# Patient Record
Sex: Female | Born: 1965 | Race: White | Hispanic: No | Smoking: Current every day smoker
Health system: Southern US, Community
[De-identification: ages and names within clinical notes are randomized; demographics above are authoritative.]

## PROBLEM LIST (undated history)

## (undated) HISTORY — PX: NEPHRECTOMY TRANSPLANTED ORGAN: SUR880

## (undated) HISTORY — PX: TONSILLECTOMY: SUR1361

## (undated) HISTORY — PX: OTHER SURGICAL HISTORY: SHX169

## (undated) HISTORY — PX: BACK SURGERY: SHX140

---

## 2004-01-28 ENCOUNTER — Encounter: Admission: RE | Admit: 2004-01-28 | Discharge: 2004-01-28 | Payer: Self-pay | Admitting: Neurosurgery

## 2004-02-18 ENCOUNTER — Encounter: Admission: RE | Admit: 2004-02-18 | Discharge: 2004-02-18 | Payer: Self-pay | Admitting: Neurosurgery

## 2004-03-03 ENCOUNTER — Encounter: Admission: RE | Admit: 2004-03-03 | Discharge: 2004-03-03 | Payer: Self-pay | Admitting: Neurosurgery

## 2004-04-18 ENCOUNTER — Ambulatory Visit (HOSPITAL_COMMUNITY): Admission: RE | Admit: 2004-04-18 | Discharge: 2004-04-19 | Payer: Self-pay | Admitting: Neurosurgery

## 2004-10-20 ENCOUNTER — Emergency Department: Payer: Self-pay | Admitting: Emergency Medicine

## 2006-07-26 ENCOUNTER — Ambulatory Visit: Payer: Self-pay | Admitting: Obstetrics and Gynecology

## 2007-01-17 ENCOUNTER — Encounter: Admission: RE | Admit: 2007-01-17 | Discharge: 2007-01-17 | Payer: Self-pay | Admitting: Occupational Medicine

## 2007-01-30 ENCOUNTER — Encounter: Admission: RE | Admit: 2007-01-30 | Discharge: 2007-03-17 | Payer: Self-pay | Admitting: Occupational Medicine

## 2007-02-06 ENCOUNTER — Encounter: Admission: RE | Admit: 2007-02-06 | Discharge: 2007-02-06 | Payer: Self-pay | Admitting: Neurosurgery

## 2007-02-09 ENCOUNTER — Encounter: Admission: RE | Admit: 2007-02-09 | Discharge: 2007-02-09 | Payer: Self-pay | Admitting: Neurosurgery

## 2007-09-08 ENCOUNTER — Encounter: Payer: Self-pay | Admitting: Family Medicine

## 2007-09-13 ENCOUNTER — Encounter: Payer: Self-pay | Admitting: Family Medicine

## 2007-09-20 ENCOUNTER — Emergency Department: Payer: Self-pay | Admitting: Emergency Medicine

## 2007-10-13 ENCOUNTER — Encounter: Payer: Self-pay | Admitting: Family Medicine

## 2007-11-18 ENCOUNTER — Emergency Department: Payer: Self-pay | Admitting: Emergency Medicine

## 2007-11-21 ENCOUNTER — Emergency Department: Payer: Self-pay | Admitting: Internal Medicine

## 2008-05-02 IMAGING — CR DG LUMBAR SPINE COMPLETE 4+V
4 series · 4 of 4 positions shown · non-contrast
Comparison: Intraoperative lateral lumbar spine radiograph 04/18/04.

CLINICAL DATA: Low back and bilateral leg pain.  Lumbar surgery in 3334. 
 LUMBAR SPINE FOUR VIEWS:

[view not recorded (1 of 4)]
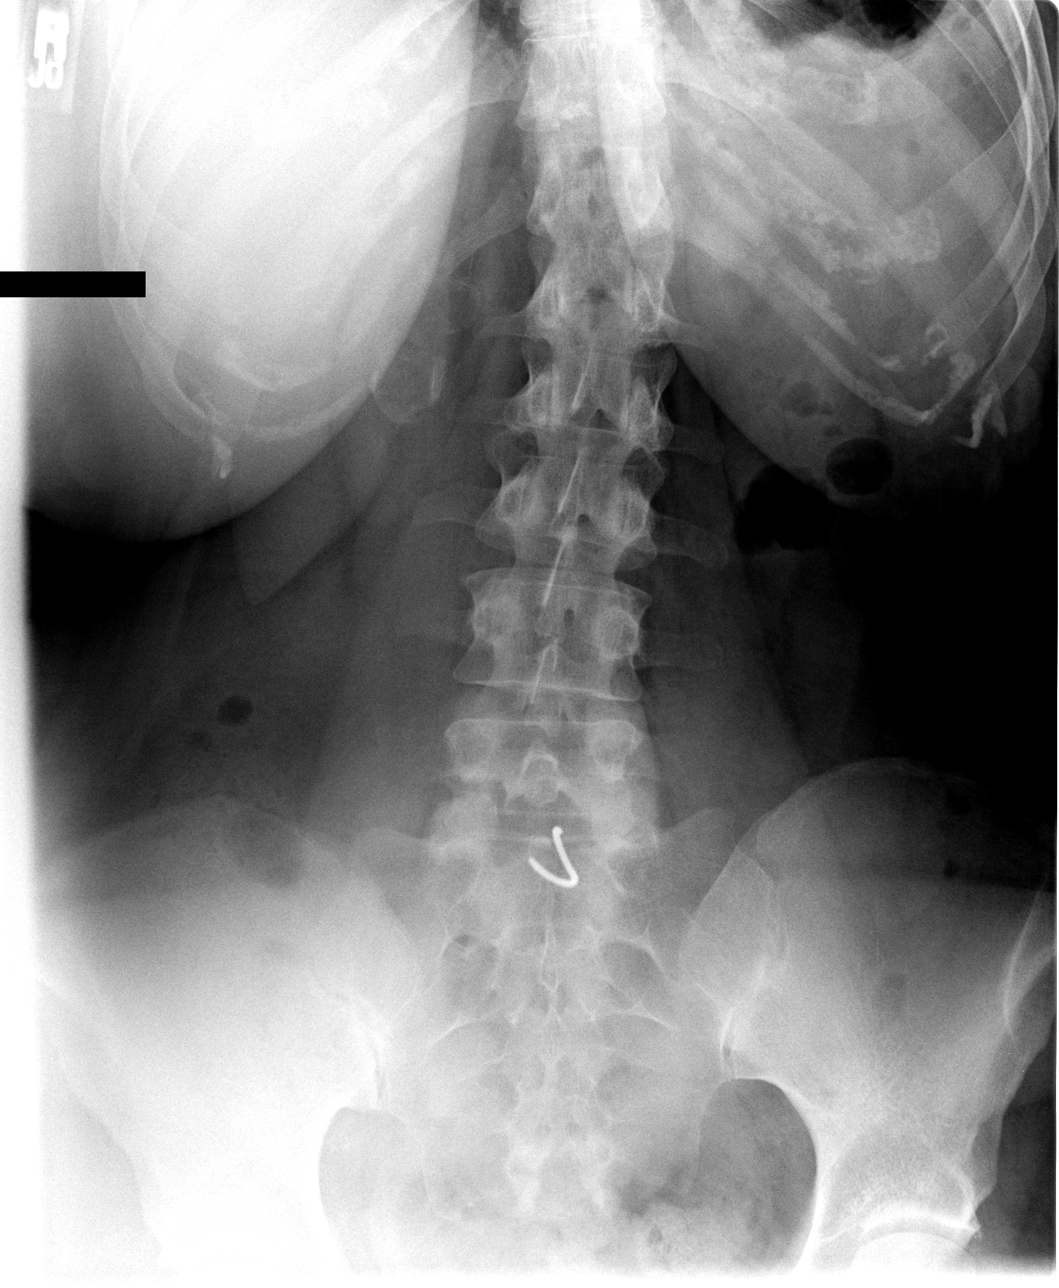

[view not recorded (2 of 4)]
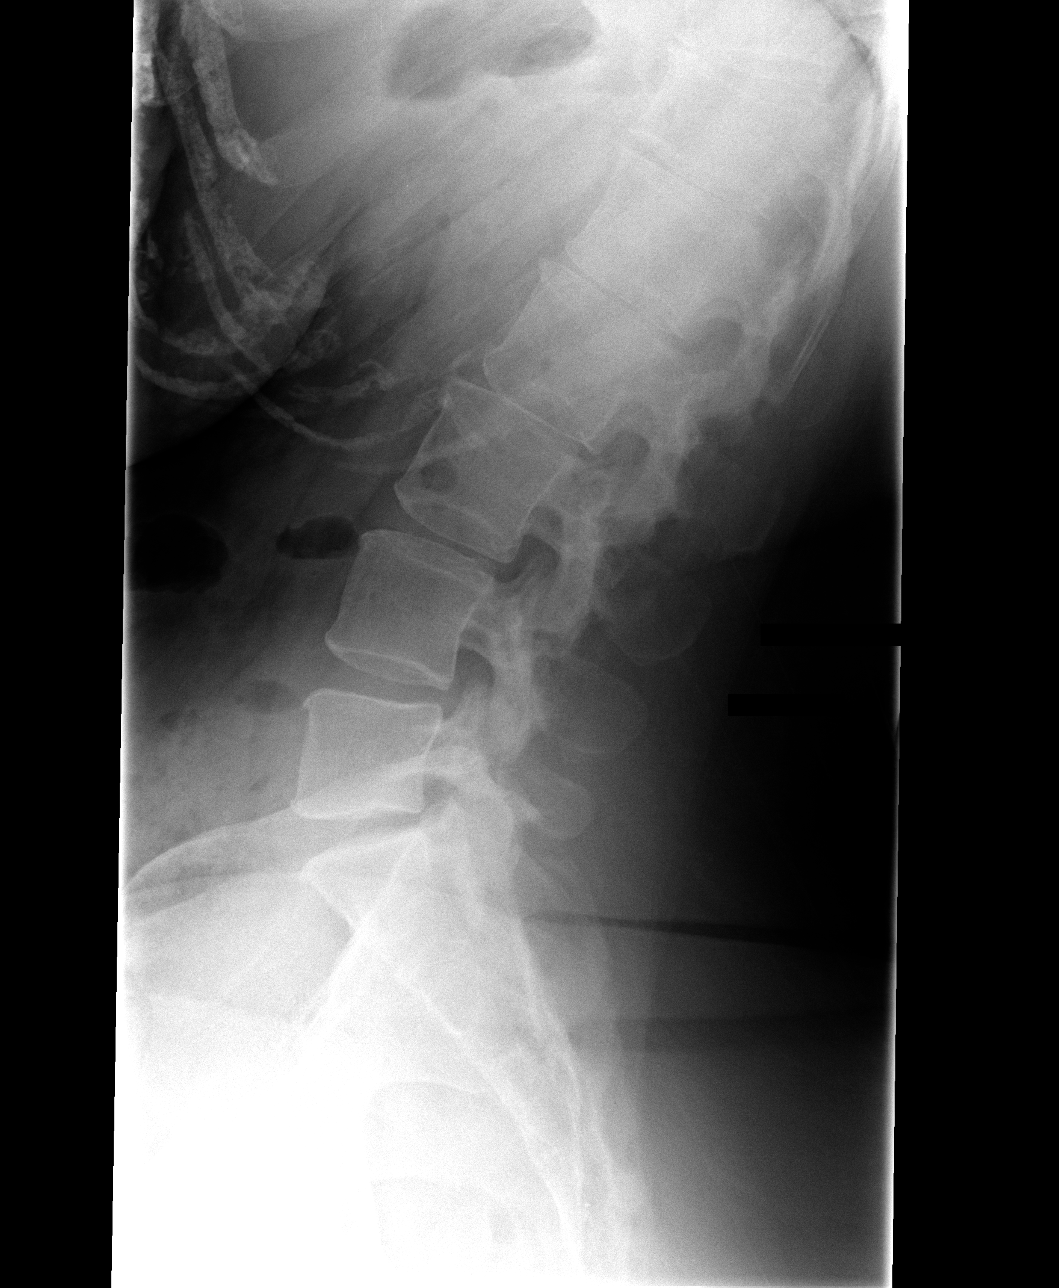

[view not recorded (3 of 4)]
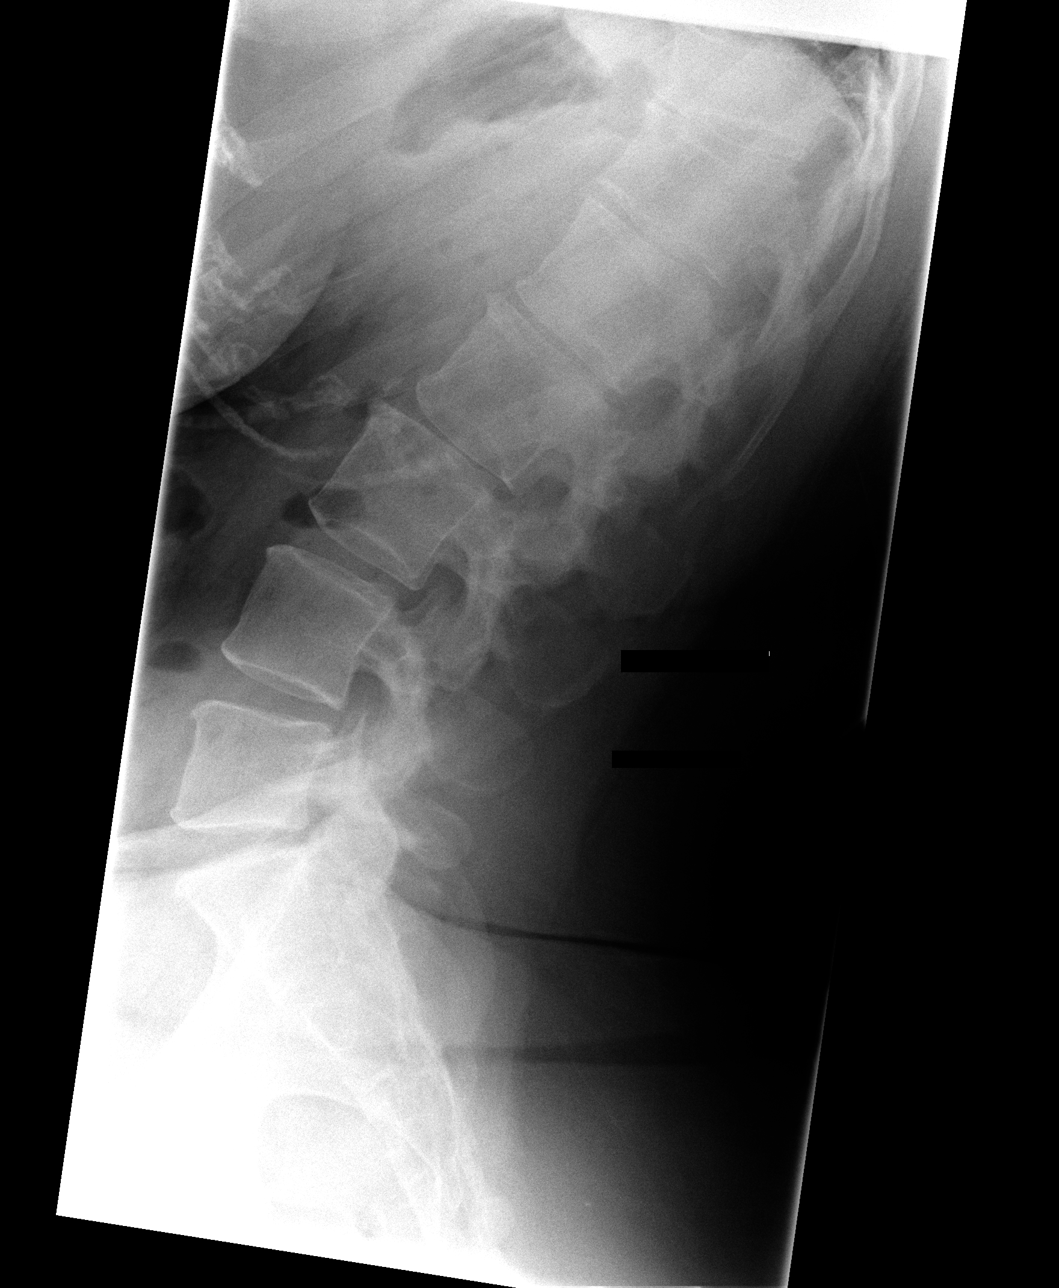

[view not recorded (4 of 4)]
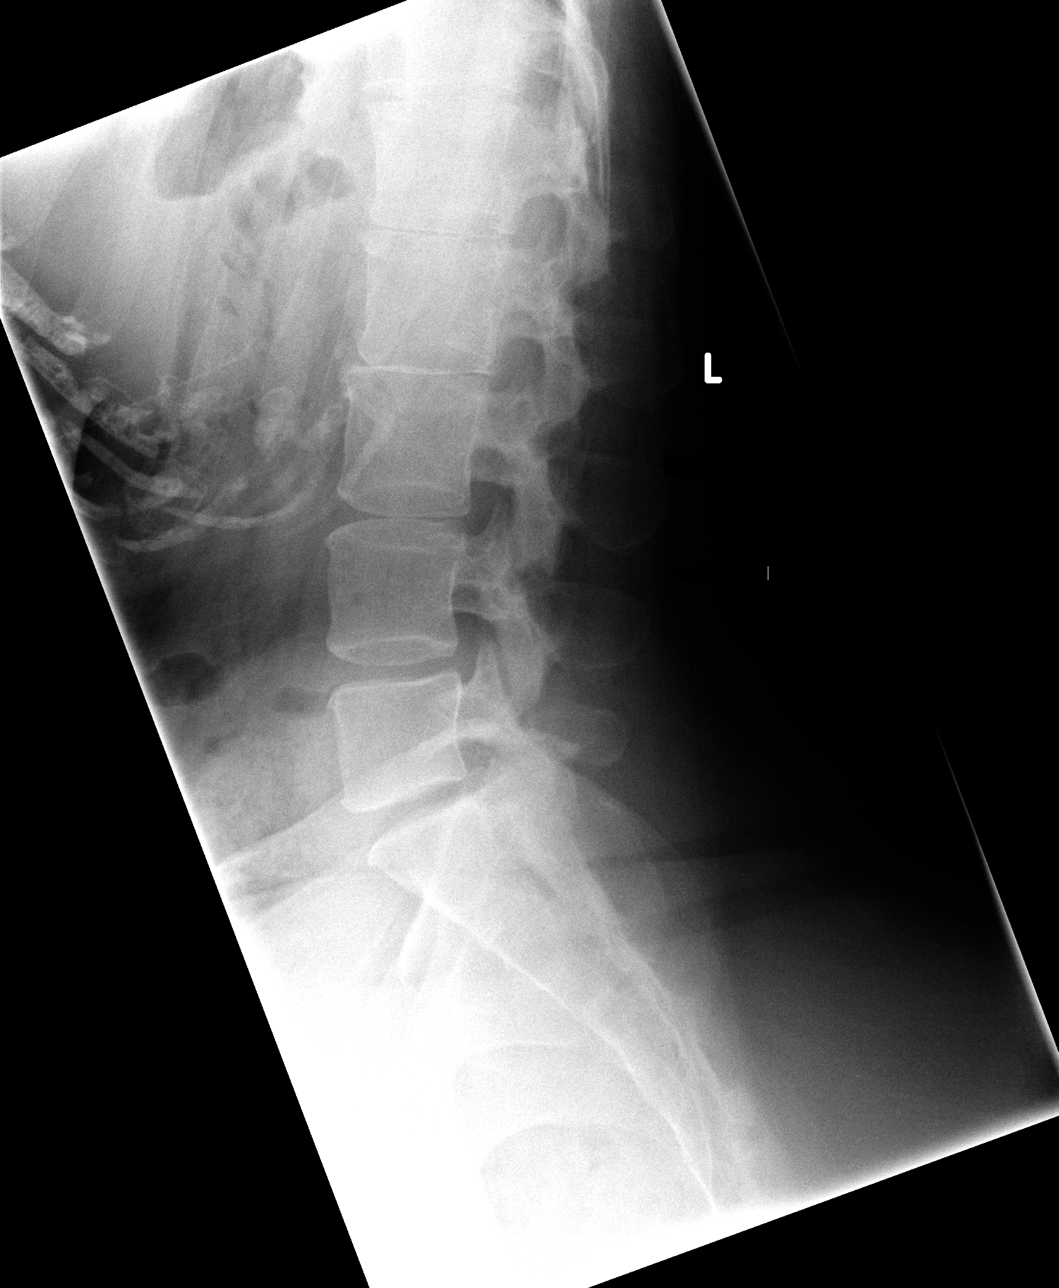

[4 of 4 positions shown; findings below may reference images not displayed]

FINDINGS: Five non rib bearing lumbar vertebrae are seen with slight levoscoliosis at the thoracolumbar spine junction.   Slight degenerative disc space narrowing is seen at L5-S1 with remaining lumbar disc spaces maintained.  Posterior vertebral alignment is normal on neutral and flexion/extension upright view.
IMPRESSION: 1.  Slight levoscoliosis thoracolumbar spine junction. 
 2.  Slight degenerative disc disease with hemilaminectomy defect L5-S1. 
 3.  Normal posterior vertebral alignment on neutral and flexion/extension upright views.

## 2009-02-17 ENCOUNTER — Ambulatory Visit: Payer: Self-pay | Admitting: Pain Medicine

## 2009-06-28 ENCOUNTER — Emergency Department: Payer: Self-pay | Admitting: Emergency Medicine

## 2009-07-04 ENCOUNTER — Ambulatory Visit: Payer: Self-pay | Admitting: Urology

## 2009-07-05 ENCOUNTER — Ambulatory Visit: Payer: Self-pay | Admitting: Urology

## 2009-07-11 ENCOUNTER — Ambulatory Visit: Payer: Self-pay | Admitting: Urology

## 2009-07-20 ENCOUNTER — Ambulatory Visit: Payer: Self-pay | Admitting: Urology

## 2012-09-29 ENCOUNTER — Emergency Department: Payer: Self-pay | Admitting: Emergency Medicine

## 2012-09-29 LAB — CBC
HCT: 45.1 % (ref 35.0–47.0)
MCH: 30.4 pg (ref 26.0–34.0)
MCHC: 33.1 g/dL (ref 32.0–36.0)
RDW: 12.9 % (ref 11.5–14.5)

## 2012-09-29 LAB — COMPREHENSIVE METABOLIC PANEL
Albumin: 4 g/dL (ref 3.4–5.0)
Anion Gap: 4 — ABNORMAL LOW (ref 7–16)
BUN: 7 mg/dL (ref 7–18)
Bilirubin,Total: 0.3 mg/dL (ref 0.2–1.0)
Chloride: 105 mmol/L (ref 98–107)
Co2: 28 mmol/L (ref 21–32)
Creatinine: 0.83 mg/dL (ref 0.60–1.30)
EGFR (African American): >60
Glucose: 130 mg/dL — ABNORMAL HIGH (ref 65–99)
Potassium: 3.6 mmol/L (ref 3.5–5.1)
SGOT(AST): 9 U/L — ABNORMAL LOW (ref 15–37)
SGPT (ALT): 13 U/L (ref 12–78)
Total Protein: 7.8 g/dL (ref 6.4–8.2)

## 2012-09-30 LAB — URINALYSIS, COMPLETE
Bilirubin,UR: NEGATIVE
Glucose,UR: NEGATIVE mg/dL (ref 0–75)
Ketone: NEGATIVE
Leukocyte Esterase: NEGATIVE
Nitrite: NEGATIVE
RBC,UR: 81 /HPF (ref 0–5)
Squamous Epithelial: 1

## 2012-10-20 ENCOUNTER — Emergency Department: Payer: Self-pay | Admitting: Emergency Medicine

## 2012-10-20 LAB — URINALYSIS, COMPLETE
Glucose,UR: NEGATIVE mg/dL (ref 0–75)
Nitrite: NEGATIVE
Ph: 6 (ref 4.5–8.0)
Protein: NEGATIVE
RBC,UR: 84 /HPF (ref 0–5)
Squamous Epithelial: <1
WBC UR: 2 /HPF (ref 0–5)

## 2012-10-22 LAB — CBC WITH DIFFERENTIAL/PLATELET
Basophil #: 0 10*3/uL (ref 0.0–0.1)
Basophil %: 0.5 %
Eosinophil #: 0 10*3/uL (ref 0.0–0.7)
HCT: 39.8 % (ref 35.0–47.0)
HGB: 13.3 g/dL (ref 12.0–16.0)
Lymphocyte #: 0.3 10*3/uL — ABNORMAL LOW (ref 1.0–3.6)
Lymphocyte %: 4.4 %
MCHC: 33.6 g/dL (ref 32.0–36.0)
MCV: 90 fL (ref 80–100)
Neutrophil #: 5.5 10*3/uL (ref 1.4–6.5)
RBC: 4.43 10*6/uL (ref 3.80–5.20)
RDW: 12.4 % (ref 11.5–14.5)
WBC: 6.3 10*3/uL (ref 3.6–11.0)

## 2012-10-22 LAB — COMPREHENSIVE METABOLIC PANEL
Bilirubin,Total: 0.4 mg/dL (ref 0.2–1.0)
Calcium, Total: 8.3 mg/dL — ABNORMAL LOW (ref 8.5–10.1)
Chloride: 108 mmol/L — ABNORMAL HIGH (ref 98–107)
Co2: 21 mmol/L (ref 21–32)
Creatinine: 0.89 mg/dL (ref 0.60–1.30)
SGOT(AST): 14 U/L — ABNORMAL LOW (ref 15–37)
SGPT (ALT): 11 U/L — ABNORMAL LOW (ref 12–78)

## 2012-10-22 LAB — URINALYSIS, COMPLETE
Glucose,UR: NEGATIVE mg/dL (ref 0–75)
Nitrite: NEGATIVE
Protein: NEGATIVE
Specific Gravity: 1.014 (ref 1.003–1.030)
WBC UR: 3 /HPF (ref 0–5)

## 2012-10-23 LAB — BASIC METABOLIC PANEL
Anion Gap: 9 (ref 7–16)
Calcium, Total: 7.7 mg/dL — ABNORMAL LOW (ref 8.5–10.1)
Chloride: 108 mmol/L — ABNORMAL HIGH (ref 98–107)
Co2: 21 mmol/L (ref 21–32)
Creatinine: 0.83 mg/dL (ref 0.60–1.30)
Sodium: 138 mmol/L (ref 136–145)

## 2012-10-23 LAB — CBC WITH DIFFERENTIAL/PLATELET
Basophil #: 0 10*3/uL (ref 0.0–0.1)
Basophil %: 0.4 %
Eosinophil #: 0 10*3/uL (ref 0.0–0.7)
HCT: 36.4 % (ref 35.0–47.0)
HGB: 12.7 g/dL (ref 12.0–16.0)
Lymphocyte #: 0.4 10*3/uL — ABNORMAL LOW (ref 1.0–3.6)
Lymphocyte %: 8.5 %
MCH: 31.7 pg (ref 26.0–34.0)
MCHC: 35 g/dL (ref 32.0–36.0)
MCV: 90 fL (ref 80–100)
Monocyte #: 0.5 x10 3/mm (ref 0.2–0.9)
Neutrophil #: 3.6 10*3/uL (ref 1.4–6.5)
RDW: 12.5 % (ref 11.5–14.5)

## 2012-10-24 ENCOUNTER — Inpatient Hospital Stay: Payer: Self-pay | Admitting: Urology

## 2012-10-24 LAB — URINE CULTURE

## 2012-10-27 LAB — CULTURE, BLOOD (SINGLE)

## 2012-10-28 LAB — CULTURE, BLOOD (SINGLE)

## 2012-10-31 ENCOUNTER — Ambulatory Visit: Payer: Self-pay | Admitting: Urology

## 2014-01-14 IMAGING — CT CT STONE STUDY
1 of 2 series · 15 of 32 positions shown, 19 images · non-contrast
Comparison: none

REASON FOR EXAM: PT REQUESTING; BILATERAL FLANK PAIN, H/O STONES
COMMENTS:   May transport without cardiac monitor

PROCEDURE:     CT  - CT ABDOMEN /PELVIS WO (STONE)  - October 20, 2012  [DATE]
RESULT:     CT and pelvis dated 10/20/2012 comparison made to prior study
dated 06/28/2009.
TECHNIQUE: Helical noncontrasted 3 mm sections were obtained from the lung
bases through the pubic symphysis.

[Series 2: 3mm soft tissue · axial · 0.75mm/px · z∈[-534,-69]mm · 15 of 169 slices shown, 19 images]
[im 7/169  soft-tissue]
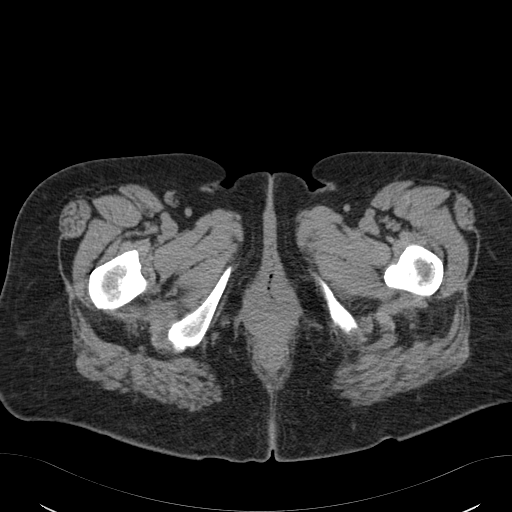
[im 7/169  bone]
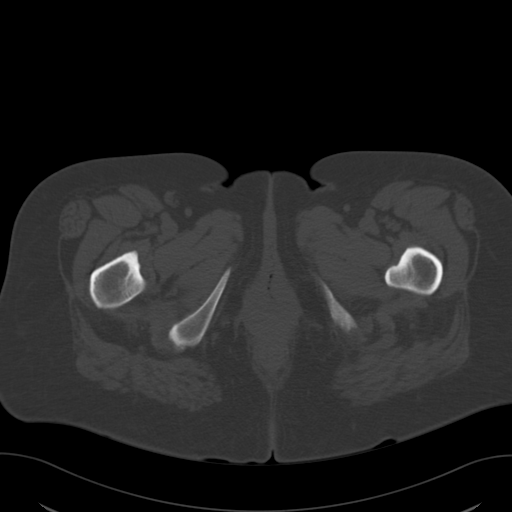
[im 21/169  soft-tissue]
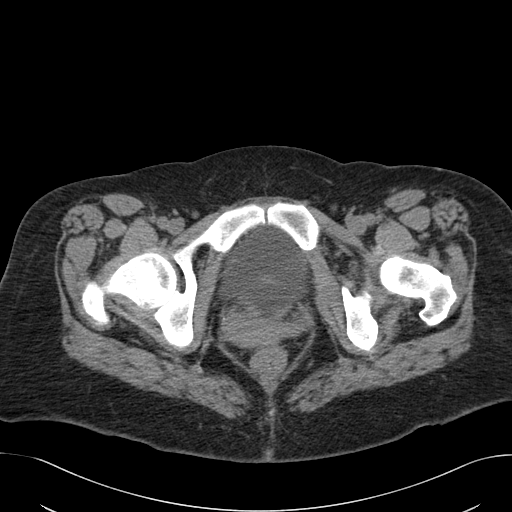
[im 34/169  soft-tissue]
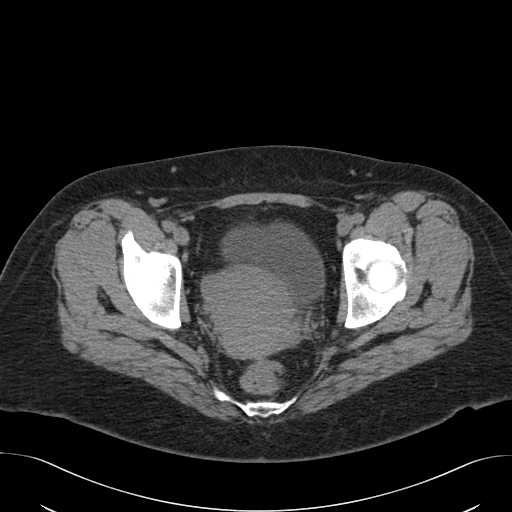
[im 48/169  soft-tissue]
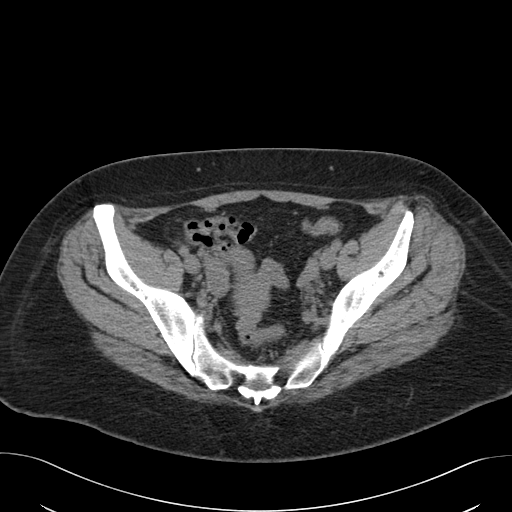
[im 61/169  soft-tissue]
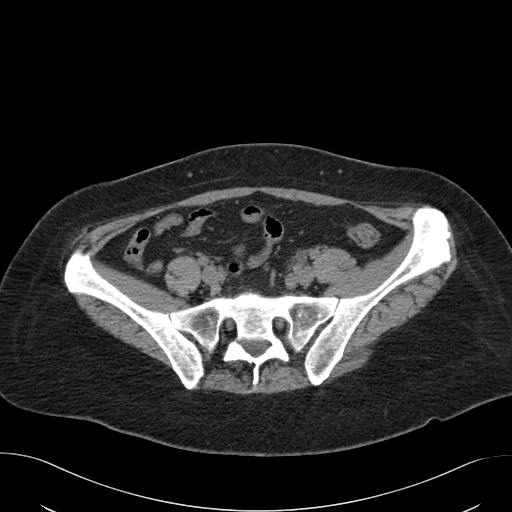
[im 74/169  soft-tissue]
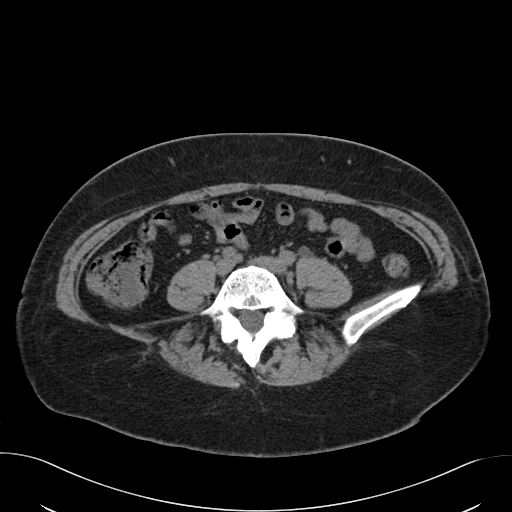
[im 88/169  soft-tissue]
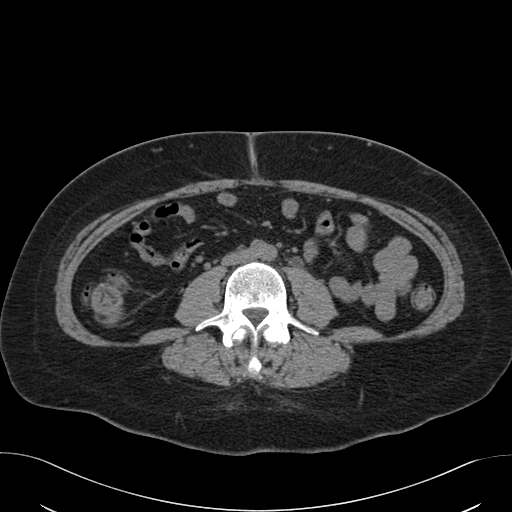
[im 95/169  soft-tissue]
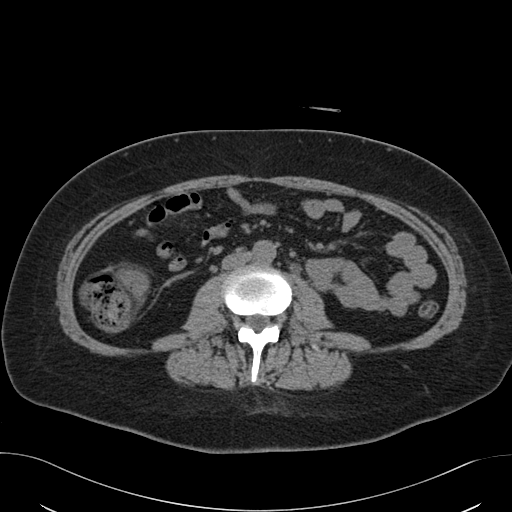
[im 108/169  soft-tissue]
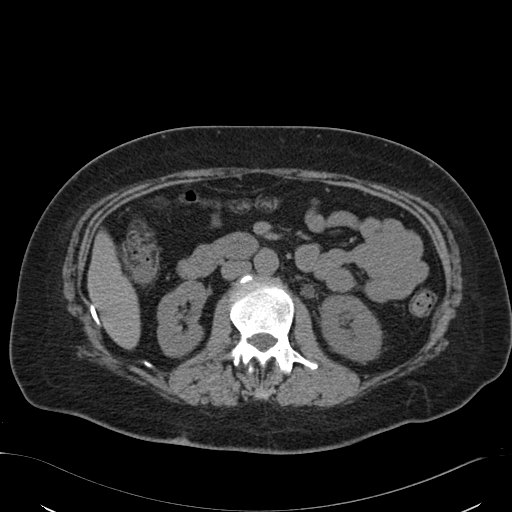
[im 108/169  bone]
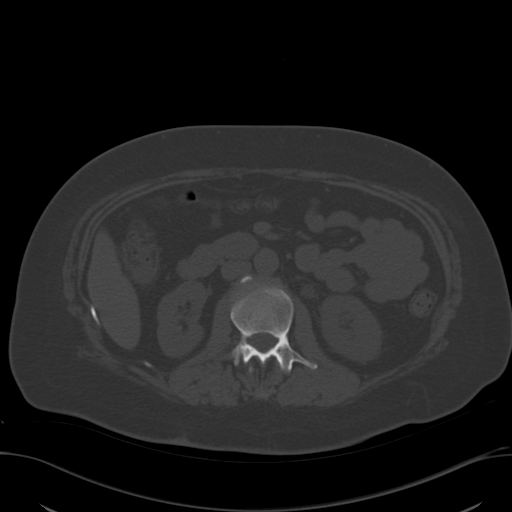
[im 121/169  soft-tissue]
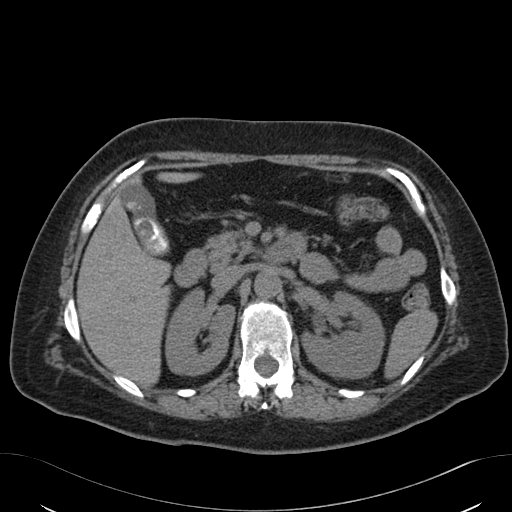
[im 135/169  soft-tissue]
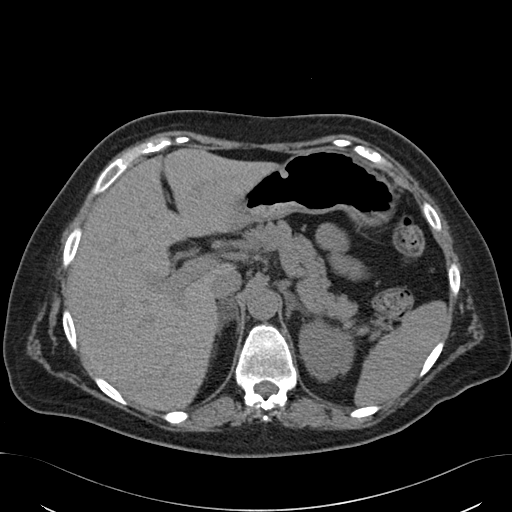
[im 142/169  lung]
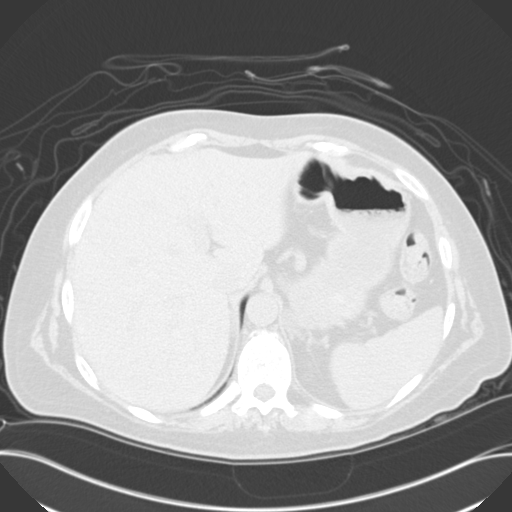
[im 148/169  soft-tissue]
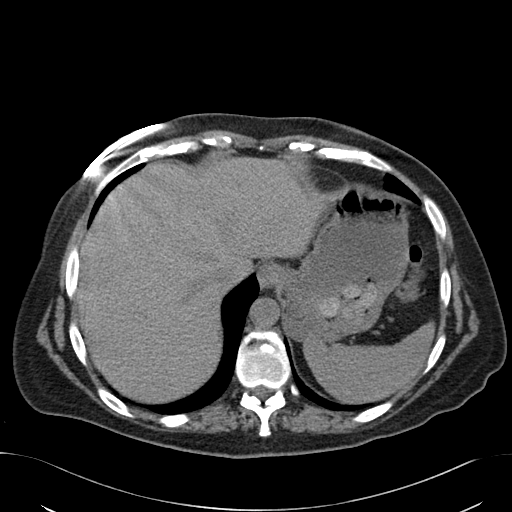
[im 148/169  lung]
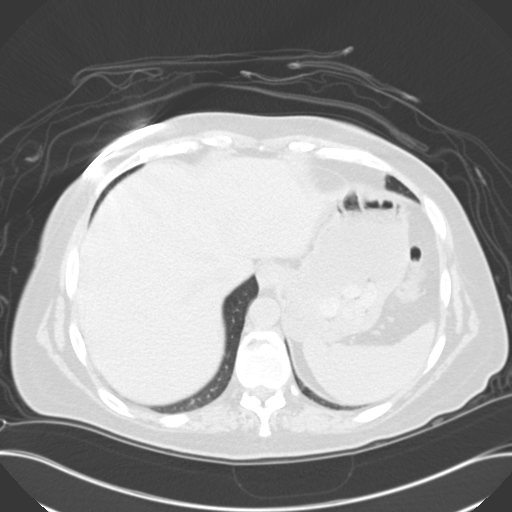
[im 155/169  lung]
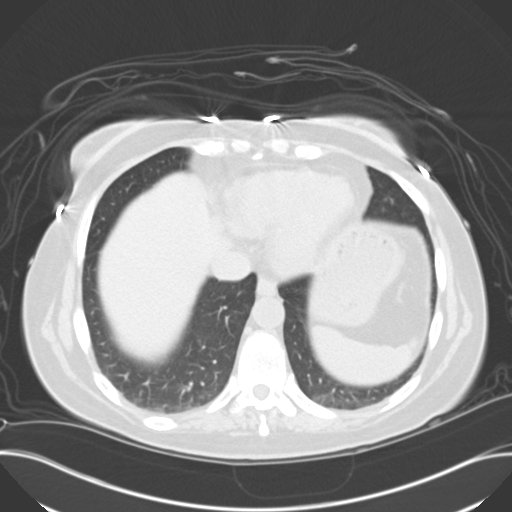
[im 162/169  soft-tissue]
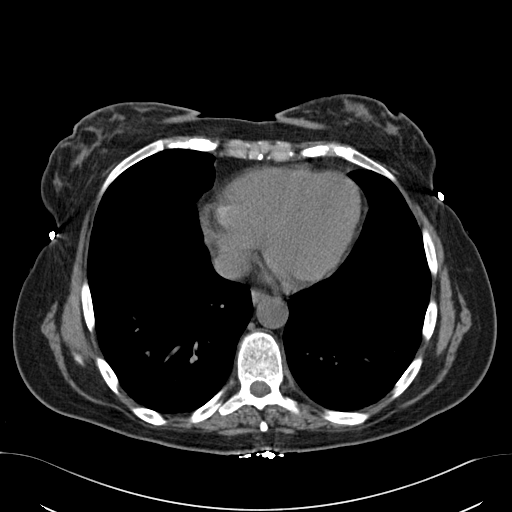
[im 162/169  lung]
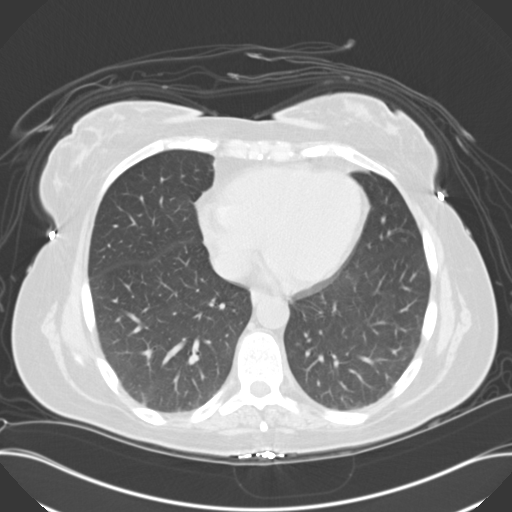

[15 of 32 positions shown; findings below may reference images not displayed]

FINDINGS: The lung bases are unremarkable.

Noncontrast evaluation of the liver, spleen, kidneys, pancreas is
unremarkable. There is prominence of the adrenals which maintain an
adreniform shape. Partially calcified gallstones identified within the
gallbladder. There is no evidence of intra nor extrahepatic biliary
dilatation.

There is mild hydronephrosis involving the left kidney as well as a 2 mm
nonobstructing medullary calculus within the midpole region. Mild
hydroureter is identified. Within the distal left ureter 5.6 lumen catheter
is appreciated.

The right kidney demonstrates a nonobstructing 3 mm midpole medullary
calculus. Is no evidence of hydronephrosis, hydroureter nor ureterolithiasis.

There is no evidence of bowel obstruction, enteritis, colitis common nor
diverticulitis. Is no evidence of abdominal aortic aneurysm.
IMPRESSION: Distal left ureteral calculus with associated mild
obstructive uropathy.
2. Calcified gallstones.
3. Bilateral nonobstructing medullary calculi.

## 2014-01-16 IMAGING — CR DG CHEST 1V PORT
1 series · 1 of 1 positions shown · non-contrast
Comparison: none

REASON FOR EXAM: fever, cough
COMMENTS:

[ap]
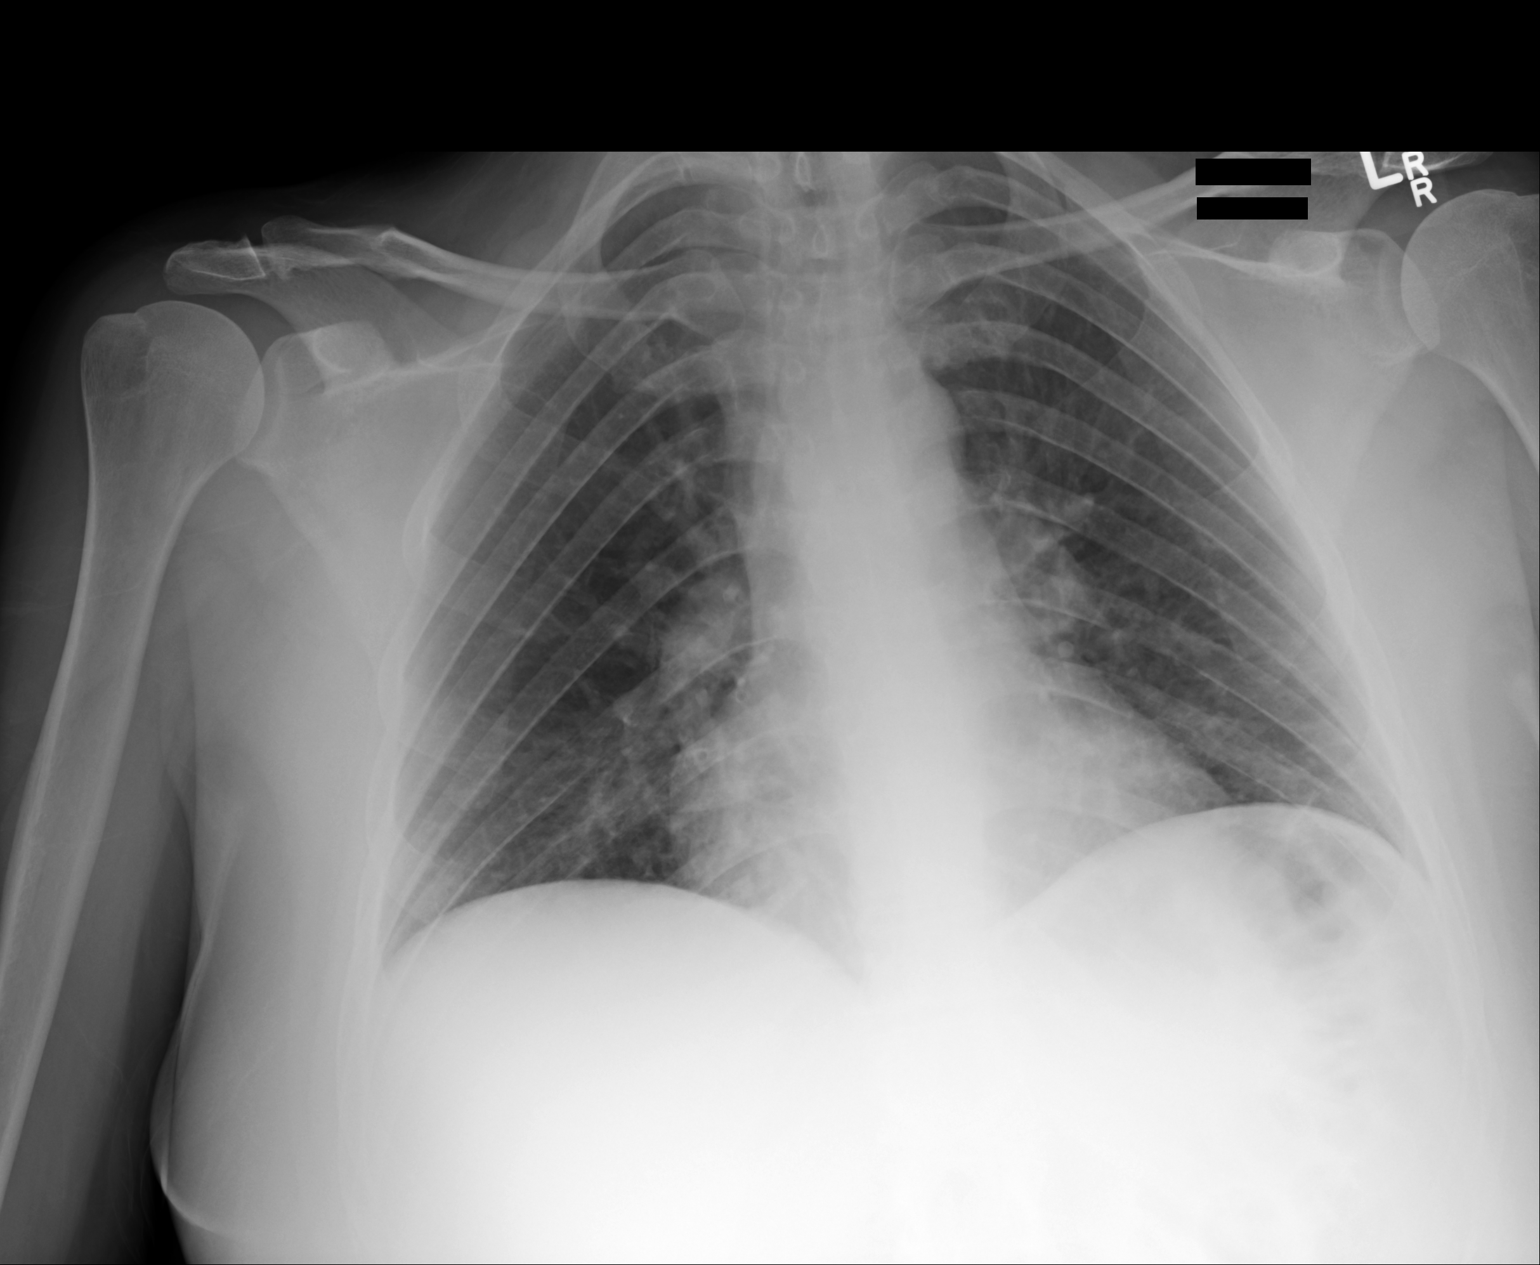

[1 of 1 positions shown; findings below may reference images not displayed]

PROCEDURE:     DXR - DXR PORTABLE CHEST SINGLE VIEW  - October 22, 2012 [DATE]

RESULT:     There is no previous exam for comparison.

The lungs are clear. The heart and pulmonary vessels are normal. The bony
and mediastinal structures are unremarkable. There is no effusion. There is
no pneumothorax or evidence of congestive failure.
IMPRESSION: No acute cardiopulmonary disease.

[REDACTED]

## 2014-07-06 ENCOUNTER — Emergency Department: Payer: Self-pay | Admitting: Emergency Medicine

## 2015-03-01 NOTE — Discharge Summary (Signed)
PATIENT NAME:  Colleen Bauer, Colleen Bauer MR#:  [id number] DATE OF BIRTH:  1966/06/25  DATE OF ADMISSION:  10/23/2012 DATE OF DISCHARGE:  10/25/2012  PROCEDURE: Cystoscopy and left ureteral stent placement.   INDICATIONS: The patient is a 49 year old white female with a history of nephrolithiasis. She initially presented to the Emergency Room with left-sided flank pain. She was noted to have an obstructing left ureteral calculus. She was scheduled for elective ureteroscopy. She developed high fever to 103 necessitating emergent hospitalization and intervention. She presented for this purpose.   HOSPITAL COURSE: Colleen Bauer was admitted on 10/23/2012 with high fever and obstructing left ureteral calculus. She was taken to the operating room at which time she underwent cystoscopy and left double-J ureteral stent placement. No significant purulent material was noted behind the stone. She had improvement in the discomfort after stent placement. She continued however to have spiking fevers. Her blood cultures and urine culture were subsequently negative. She had been placed on Cipro just prior to admission. This could potentially affect her culture results. She was also noted to have bilateral pulmonary infiltrates with no definitive evidence of pneumonia. She did have significant cough with sputum production. She was maintained on IV antibiotics. She was tolerating a general diet. She had subsequent gradual improvement in her fevers over the next 48 hours. By 10/25/2012 the fever was remaining below 101, other vital signs remained stable, she was maintaining good urine output, she was ambulating without difficulty and the cough was also improving. She was subsequently discharged to home on 10/25/2012. She is scheduled for elective ureteroscopy and stone removal in the near future. She was discharged on her presentation medications which were Cipro 500 mg for a 10 day course and Percocet as needed for pain. She was to  resume her other premedications. ____________________________ Colleen FriezeBrian Bauer. Achilles Dunkope, MD bsc:sb D: 11/10/2012 09:03:00 ET     T: 11/10/2012 09:25:16 ET       JOB#: [id number] cc: Colleen FriezeBrian Bauer. Achilles Dunkope, MD, <Dictator> Colleen FriezeBRIAN Bauer [name] MD ELECTRONICALLY SIGNED 11/11/2012 7:55

## 2015-03-01 NOTE — Op Note (Signed)
PATIENT NAME:  Colleen Bauer, WEINHEIMER MR#:  [id number] DATE OF BIRTH:  03/20/1966  DATE OF PROCEDURE:  10/23/2012  PREOPERATIVE DIAGNOSES:  1. Left ureterolithiasis.  2. Hydronephrosis.  3. Sepsis.   POSTOPERATIVE DIAGNOSES: 1. Left ureterolithiasis.  2. Hydronephrosis.  3. Sepsis.   PROCEDURES:  1. Cystoscopy. 2. Left double-J ureteral stent placement.   SURGEON: Denice Bors. Jacqlyn Larsen, MD   ANESTHESIA: Laryngeal mask airway anesthesia.   INDICATIONS: The patient is a 49 year old white female with a history of nephrolithiasis. She initially presented to the Emergency Room approximately three weeks ago with left-sided flank pain. She felt that the symptoms were consistent with prior stone episodes. No x-rays were obtained. She was seen in the office recently. She was subsequently scheduled for a CT scan for further evaluation. No stones were noted on KUB. She was seen in the office earlier on 10/22/2012. A CT scan demonstrated a 4 mm mid left ureteral calculus with obstruction. There was no evidence of sepsis at that time. She was placed on an antibiotic as well as Flomax to help facilitate stone passage. She was scheduled for ureteroscopic stone removal the following Tuesday. She developed progressive fevers after her presentation to the office to 104. She was 102 at the time of presentation to the Emergency Room consistent with impending sepsis. She was taken to the operating room for cystoscopy and emergent stent placement.   PROCEDURE: After informed consent was obtained, the patient was taken to the operating room and placed in the dorsal lithotomy position under laryngeal mask airway anesthesia. The patient was then prepped and draped in the usual standard fashion. The 19 French rigid cystoscope was introduced into the urethra under direct vision with no urethral abnormalities noted. Upon entering the bladder, the mucosa was inspected in its entirety with no gross mucosal lesions noted. Bilateral  ureteral orifices were well visualized with no lesions noted. A guidewire was advanced into the left ureteral orifice. It was easily advanced into the upper pole collecting system under fluoroscopic guidance without difficulty. No resistance was met at the level of the stone. A 6 French x 26 cm double-J ureteral stent was advanced over the guidewire. Resistance was met at the level of the stone. With persistent pressure and manipulation, the stent was able to be advanced past the stone. It was advanced into the renal pelvis. Upon withdrawal of the guidewire, minimal curl was appreciated of the stent in the renal pelvis. With manipulation of the distal end of the stent with the curl in the bladder, a curl was able to be formed within the renal pelvis. No significant efflux of urine or purulent material was appreciated with the stent placement. The bladder was drained. The cystoscope was removed. The patient was returned to the supine position and awakened from laryngeal mask airway anesthesia. She was taken to the recovery room in stable condition. There were no problems or complications. The patient tolerated the procedure well.   ____________________________ Denice Bors. Jacqlyn Larsen, MD bsc:drc D: 10/23/2012 08:58:26 ET T: 10/23/2012 09:19:52 ET JOB#: [id number]  cc: Denice Bors. Jacqlyn Larsen, MD, <Dictator> Denice Bors Archer Moist MD ELECTRONICALLY SIGNED 10/24/2012 17:25

## 2015-07-29 ENCOUNTER — Emergency Department
Admission: EM | Admit: 2015-07-29 | Discharge: 2015-07-29 | Disposition: A | Discharge status: Home / Self Care | Payer: Self-pay | Attending: Emergency Medicine | Admitting: Emergency Medicine

## 2015-07-29 ENCOUNTER — Encounter: Payer: Self-pay | Admitting: Emergency Medicine

## 2015-07-29 DIAGNOSIS — J300 Vasomotor rhinitis: Secondary | ICD-10-CM | POA: Insufficient documentation

## 2015-07-29 DIAGNOSIS — Z720 Tobacco use: Secondary | ICD-10-CM | POA: Insufficient documentation

## 2015-07-29 DIAGNOSIS — J0100 Acute maxillary sinusitis, unspecified: Secondary | ICD-10-CM | POA: Insufficient documentation

## 2015-07-29 DIAGNOSIS — Z880 Allergy status to penicillin: Secondary | ICD-10-CM | POA: Insufficient documentation

## 2015-07-29 DIAGNOSIS — Z79899 Other long term (current) drug therapy: Secondary | ICD-10-CM | POA: Insufficient documentation

## 2015-07-29 MED ORDER — LEVOFLOXACIN 500 MG PO TABS: 500.0000 mg | ORAL_TABLET | Freq: Every day | ORAL | Status: AC

## 2015-07-29 MED ORDER — FLUTICASONE PROPIONATE 50 MCG/ACT NA SUSP: 1.0000 | Freq: Every day | NASAL | Status: AC

## 2015-07-29 NOTE — Discharge Instructions (Signed)
Sinusitis °Sinusitis is redness, soreness, and inflammation of the paranasal sinuses. Paranasal sinuses are air pockets within the bones of your face (beneath the eyes, the middle of the forehead, or above the eyes). In healthy paranasal sinuses, mucus is able to drain out, and air is able to circulate through them by way of your nose. However, when your paranasal sinuses are inflamed, mucus and air can become trapped. This can allow bacteria and other germs to grow and cause infection. °Sinusitis can develop quickly and last only a short time (acute) or continue over a long period (chronic). Sinusitis that lasts for more than 12 weeks is considered chronic.  °CAUSES  °Causes of sinusitis include: °· Allergies. °· Structural abnormalities, such as displacement of the cartilage that separates your nostrils (deviated septum), which can decrease the air flow through your nose and sinuses and affect sinus drainage. °· Functional abnormalities, such as when the small hairs (cilia) that line your sinuses and help remove mucus do not work properly or are not present. °SIGNS AND SYMPTOMS  °Symptoms of acute and chronic sinusitis are the same. The primary symptoms are pain and pressure around the affected sinuses. Other symptoms include: °· Upper toothache. °· Earache. °· Headache. °· Bad breath. °· Decreased sense of smell and taste. °· A cough, which worsens when you are lying flat. °· Fatigue. °· Fever. °· Thick drainage from your nose, which often is green and may contain pus (purulent). °· Swelling and warmth over the affected sinuses. °DIAGNOSIS  °Your health care provider will perform a physical exam. During the exam, your health care provider may: °· Look in your nose for signs of abnormal growths in your nostrils (nasal polyps). °· Tap over the affected sinus to check for signs of infection. °· View the inside of your sinuses (endoscopy) using an imaging device that has a light attached (endoscope). °If your health  care provider suspects that you have chronic sinusitis, one or more of the following tests may be recommended: °· Allergy tests. °· Nasal culture. A sample of mucus is taken from your nose, sent to a lab, and screened for bacteria. °· Nasal cytology. A sample of mucus is taken from your nose and examined by your health care provider to determine if your sinusitis is related to an allergy. °TREATMENT  °Most cases of acute sinusitis are related to a viral infection and will resolve on their own within 10 days. Sometimes medicines are prescribed to help relieve symptoms (pain medicine, decongestants, nasal steroid sprays, or saline sprays).  °However, for sinusitis related to a bacterial infection, your health care provider will prescribe antibiotic medicines. These are medicines that will help kill the bacteria causing the infection.  °Rarely, sinusitis is caused by a fungal infection. In theses cases, your health care provider will prescribe antifungal medicine. °For some cases of chronic sinusitis, surgery is needed. Generally, these are cases in which sinusitis recurs more than 3 times per year, despite other treatments. °HOME CARE INSTRUCTIONS  °· Drink plenty of water. Water helps thin the mucus so your sinuses can drain more easily. °· Use a humidifier. °· Inhale steam 3 to 4 times a day (for example, sit in the bathroom with the shower running). °· Apply a warm, moist washcloth to your face 3 to 4 times a day, or as directed by your health care provider. °· Use saline nasal sprays to help moisten and clean your sinuses. °· Take medicines only as directed by your health care provider. °·   If you were prescribed either an antibiotic or antifungal medicine, finish it all even if you start to feel better. SEEK IMMEDIATE MEDICAL CARE IF:  You have increasing pain or severe headaches.  You have nausea, vomiting, or drowsiness.  You have swelling around your face.  You have vision problems.  You have a stiff  neck.  You have difficulty breathing. MAKE SURE YOU:   Understand these instructions.  Will watch your condition.  Will get help right away if you are not doing well or get worse. Document Released: 10/29/2005 Document Revised: 03/15/2014 Document Reviewed: 11/13/2011 [facility] Patient Information 2015 [place], [place]. This information is not intended to replace advice given to you by your health care provider. Make sure you discuss any questions you have with your health care provider.  Take the prescription meds as directed. Continue your home meds as discussed.  Follow-up with your primary provider as needed.

## 2015-07-29 NOTE — ED Notes (Signed)
Patient presents to the ED with left sided facial pain and left ear pain x 3 weeks.  Patient states she has taken mucinex with no improvement.  Patient reports history of sinus infections and states this feels the same.  Patient states she goes to the pain clinic and gets pain medication there that has been helping somewhat with the pain but states the pain and pressure seems to be getting worse.

## 2015-07-29 NOTE — ED Provider Notes (Signed)
[facility] Department Provider Note ____________________________________________  Time seen: 1610  I have reviewed the triage vital signs and the nursing notes.  HISTORY  Chief Complaint  Otalgia  HPI Colleen Bauer is a 49 y.o. female reports to the ED for evaluation management of left-sided sinus pain and pressure and left ear ache for the last 3 weeks. She is taking over-the-counter Mucinex without significant change in her symptoms. She describes her symptoms as feeling similar to her prior sinus infections.   History reviewed. No pertinent past medical history.  There are no active problems to display for this patient.   Past Surgical History  Procedure Laterality Date  . Back surgery    . Arm surgery    . Nephrectomy transplanted organ    . Tonsillectomy      Current Outpatient Rx  Name  Route  Sig  Dispense  Refill  . oxycodone (ROXICODONE) 30 MG immediate release tablet   Oral   Take 30 mg by mouth every 4 (four) hours as needed for pain.         . fluticasone (FLONASE) 50 MCG/ACT nasal spray   Each Nare   Place 1 spray into both nostrils daily.   16 g   0   . levofloxacin (LEVAQUIN) 500 MG tablet   Oral   Take 1 tablet (500 mg total) by mouth daily.   7 tablet   0    Allergies Penicillins  No family history on file.  Social History Social History  Substance Use Topics  . Smoking status: Current Every Day Smoker -- 0.50 packs/day    Types: Cigarettes  . Smokeless tobacco: None  . Alcohol Use: No   Review of Systems  Constitutional: Negative for fever. Eyes: Negative for visual changes. ENT: Negative for sore throat. Reports sinus pressure and earache Cardiovascular: Negative for chest pain. Respiratory: Negative for shortness of breath. Gastrointestinal: Negative for abdominal pain, vomiting and diarrhea. Genitourinary: Negative for dysuria. Musculoskeletal: Negative for back pain. Skin: Negative for  rash. Neurological: Negative for headaches, focal weakness or numbness. ____________________________________________  PHYSICAL EXAM:  VITAL SIGNS: ED Triage Vitals  Enc Vitals Group     BP 07/29/15 1544 160/89 mmHg     Pulse Rate 07/29/15 1544 93     Resp 07/29/15 1544 16     Temp 07/29/15 1544 98.5 F (36.9 C)     Temp Source 07/29/15 1544 Oral     SpO2 07/29/15 1544 96 %     Weight 07/29/15 1544 190 lb (86.183 kg)     Height 07/29/15 1544  (1.676 m)     Head Cir --      Peak Flow --      Pain Score 07/29/15 1544 8     Pain Loc --      Pain Edu? --      Excl. in GC? --    Constitutional: Alert and oriented. Well appearing and in no distress. Eyes: Conjunctivae are normal. PERRL. Normal extraocular movements. Ears: Canals clear and TMs intact without effusion or infection.   Head: Normocephalic and atraumatic.   Nose: No congestion/rhinorrhea. Nasal turbinates erythematous and edematous.   Mouth/Throat: Mucous membranes are moist.   Neck: Supple. No thyromegaly. Hematological/Lymphatic/Immunological: No cervical lymphadenopathy. Cardiovascular: Normal rate, regular rhythm.  Respiratory: Normal respiratory effort. No wheezes/rales/rhonchi. Gastrointestinal: Soft and nontender. No distention. Musculoskeletal: Nontender with normal range of motion in all extremities.  Neurologic:  Normal gait without ataxia. Normal speech  and language. No gross focal neurologic deficits are appreciated. Skin:  Skin is warm, dry and intact. No rash noted. Psychiatric: Mood and affect are normal. Patient exhibits appropriate insight and judgment. ____________________________________________  INITIAL IMPRESSION / ASSESSMENT AND PLAN / ED COURSE  Acute sinusitis and rhinitis. We'll treat with prescription Flonase, and Levaquin due to patient drug allergy profile. The primary care provider for worsening symptoms. ____________________________________________  FINAL CLINICAL  IMPRESSION(S) / ED DIAGNOSES  Final diagnoses:  Acute maxillary sinusitis, recurrence not specified  Vasomotor rhinitis      Lissa Hoard, PA-C 07/30/15 1620  Phineas Semen, MD 07/30/15 1729

## 2015-11-24 ENCOUNTER — Emergency Department
Admission: EM | Admit: 2015-11-24 | Discharge: 2015-11-24 | Disposition: A | Discharge status: Home / Self Care | Payer: Self-pay | Attending: Emergency Medicine | Admitting: Emergency Medicine

## 2015-11-24 ENCOUNTER — Encounter: Payer: Self-pay | Admitting: Medical Oncology

## 2015-11-24 DIAGNOSIS — F17210 Nicotine dependence, cigarettes, uncomplicated: Secondary | ICD-10-CM | POA: Insufficient documentation

## 2015-11-24 DIAGNOSIS — K029 Dental caries, unspecified: Secondary | ICD-10-CM | POA: Insufficient documentation

## 2015-11-24 DIAGNOSIS — Z79899 Other long term (current) drug therapy: Secondary | ICD-10-CM | POA: Insufficient documentation

## 2015-11-24 DIAGNOSIS — K0381 Cracked tooth: Secondary | ICD-10-CM | POA: Insufficient documentation

## 2015-11-24 DIAGNOSIS — J3489 Other specified disorders of nose and nasal sinuses: Secondary | ICD-10-CM

## 2015-11-24 DIAGNOSIS — Z880 Allergy status to penicillin: Secondary | ICD-10-CM | POA: Insufficient documentation

## 2015-11-24 DIAGNOSIS — J0100 Acute maxillary sinusitis, unspecified: Secondary | ICD-10-CM | POA: Insufficient documentation

## 2015-11-24 MED ORDER — AMOXICILLIN 500 MG PO CAPS: 500.0000 mg | ORAL_CAPSULE | Freq: Three times a day (TID) | ORAL | Status: AC

## 2015-11-24 NOTE — ED Provider Notes (Signed)
[facility] Emergency Department Provider Note ____________________________________________  Time seen: 1720  I have reviewed the triage vital signs and the nursing notes.  HISTORY  Chief Complaint  Dental Pain  HPI Colleen Butchereresa S Bauer is a 50 y.o. female as it to the ED with multiple complaints on the right side of face including ear pressure, right-sided sinus pressure, and some right-sided dental pain. She describes her symptoms and the TV use a consistent with a sinusitis. She denies any interim fevers, chills, sweats. Denies any purulent nasal discharge. She had a single Levaquin tablet left over from a previous prescription which she dosed today, but denies any symptom relief following that medication.   History reviewed. No pertinent past medical history.  There are no active problems to display for this patient.   Past Surgical History  Procedure Laterality Date  . Back surgery    . Arm surgery    . Nephrectomy transplanted organ    . Tonsillectomy      Current Outpatient Rx  Name  Route  Sig  Dispense  Refill  . amoxicillin (AMOXIL) 500 MG capsule   Oral   Take 1 capsule (500 mg total) by mouth 3 (three) times daily.   30 capsule   0   . fluticasone (FLONASE) 50 MCG/ACT nasal spray   Each Nare   Place 1 spray into both nostrils daily.   16 g   0   . oxycodone (ROXICODONE) 30 MG immediate release tablet   Oral   Take 30 mg by mouth every 4 (four) hours as needed for pain.          Allergies Penicillins  No family history on file.  Social History Social History  Substance Use Topics  . Smoking status: Current Every Day Smoker -- 0.50 packs/day    Types: Cigarettes  . Smokeless tobacco: None  . Alcohol Use: No   Review of Systems  Constitutional: Negative for fever. Eyes: Negative for visual changes. ENT: Negative for sore throat. Reports right facial tenderness.  Cardiovascular: Negative for chest pain. Respiratory:  Negative for shortness of breath. Gastrointestinal: Negative for abdominal pain, vomiting and diarrhea. Genitourinary: Negative for dysuria. Musculoskeletal: Negative for back pain. Skin: Negative for rash. Neurological: Negative for headaches, focal weakness or numbness. ____________________________________________  PHYSICAL EXAM:  VITAL SIGNS: ED Triage Vitals  Enc Vitals Group     BP 11/24/15 1549 140/86 mmHg     Pulse Rate 11/24/15 1549 87     Resp 11/24/15 1549 18     Temp 11/24/15 1549 97.7 F (36.5 C)     Temp Source 11/24/15 1549 Oral     SpO2 11/24/15 1549 100 %     Weight 11/24/15 1549 180 lb (81.647 kg)     Height 11/24/15 1549 5\' 6"  (1.676 m)     Head Cir --      Peak Flow --      Pain Score 11/24/15 1549 9     Pain Loc --      Pain Edu? --      Excl. in GC? --    Constitutional: Alert and oriented. Well appearing and in no distress. Head: Normocephalic and atraumatic.      Eyes: Conjunctivae are normal. PERRL. Normal extraocular movements      Ears: Canals clear. TMs intact bilaterally.   Nose: No congestion/rhinorrhea. Mild turbinate edema noted.    Mouth/Throat: Mucous membranes are moist. Lower right wisdom tooth with a chronic fracture noted posteriorly.  Neck: Supple. No thyromegaly. Hematological/Lymphatic/Immunological: No cervical lymphadenopathy. Cardiovascular: Normal rate, regular rhythm.  Respiratory: Normal respiratory effort. No wheezes/rales/rhonchi. Gastrointestinal: Soft and nontender. No distention. Musculoskeletal: Nontender with normal range of motion in all extremities.  Neurologic:  Normal gait without ataxia. Normal speech and language. No gross focal neurologic deficits are appreciated. Skin:  Skin is warm, dry and intact. No rash noted. Psychiatric: Mood and affect are normal. Patient exhibits appropriate insight and judgment. ____________________________________________  INITIAL IMPRESSION / ASSESSMENT AND PLAN / ED  COURSE  Patient with symptoms of a subacute sinusitis versus acute dental abscess. She will be discharged with a prescription for amoxicillin. She claims she has tolerated amoxil in the past, despite a PCN allergy. She will restart her Flonase and start a daily allergy medicine + pseudoephedrine. Follow-up with primary provider for ongoing symptoms.  ____________________________________________  FINAL CLINICAL IMPRESSION(S) / ED DIAGNOSES  Final diagnoses:  Pain due to dental caries  Sinus pressure  Acute maxillary sinusitis, recurrence not specified      Lissa Hoard, PA-C 11/24/15 2316  Loleta Rose, MD 11/24/15 2357

## 2015-11-24 NOTE — Discharge Instructions (Signed)
Dental Pain Dental pain may be caused by many things, including:  Tooth decay (cavities or caries). Cavities cause the nerve of your tooth to be open to air and hot or cold temperatures. This can cause pain or discomfort.  Abscess or infection. A dental abscess is an area that is full of infected pus from a bacterial infection in the inner part of the tooth (pulp). It usually happens at the end of the tooth's root.  Injury.  An unknown reason (idiopathic). Your pain may be mild or severe. It may only happen when:  You are chewing.  You are exposed to hot or cold temperature.  You are eating or drinking sugary foods or beverages, such as:  Soda.  Candy. Your pain may also be there all of the time. HOME CARE Watch your dental pain for any changes. Do these things to lessen your discomfort:  Take medicines only as told by your dentist.  If your dentist tells you to take an antibiotic medicine, finish all of it even if you start to feel better.  Keep all follow-up visits as told by your dentist. This is important.  Do not apply heat to the outside of your face.  Rinse your mouth or gargle with salt water if told by your dentist. This helps with pain and swelling.  You can make salt water by adding  tsp of salt to 1 cup of warm water.  Apply ice to the painful area of your face:  Put ice in a plastic bag.  Place a towel between your skin and the bag.  Leave the ice on for 20 minutes, 2-3 times per day.  Avoid foods or drinks that cause you pain, such as:  Very hot or very cold foods or drinks.  Sweet or sugary foods or drinks. GET HELP IF:  Your pain is not helped with medicines.  Your symptoms are worse.  You have new symptoms. GET HELP RIGHT AWAY IF:  You cannot open your mouth.  You are having trouble breathing or swallowing.  You have a fever.  Your face, neck, or jaw is puffy (swollen).   This information is not intended to replace advice given to  you by your health care provider. Make sure you discuss any questions you have with your health care provider.   Document Released: 04/16/2008 Document Revised: 03/15/2015 Document Reviewed: 10/25/2014 Elsevier Interactive Patient Education 2016 [employer]  Sinusitis, Adult Sinusitis is redness, soreness, and puffiness (inflammation) of the air pockets in the bones of your face (sinuses). The redness, soreness, and puffiness can cause air and mucus to get trapped in your sinuses. This can allow germs to grow and cause an infection.  HOME CARE   Drink enough fluids to keep your pee (urine) clear or pale yellow.  Use a humidifier in your home.  Run a hot shower to create steam in the bathroom. Sit in the bathroom with the door closed. Breathe in the steam 3-4 times a day.  Put a warm, moist washcloth on your face 3-4 times a day, or as told by your doctor.  Use salt water sprays (saline sprays) to wet the thick fluid in your nose. This can help the sinuses drain.  Only take medicine as told by your doctor. GET HELP RIGHT AWAY IF:   Your pain gets worse.  You have very bad headaches.  You are sick to your stomach (nauseous).  You throw up (vomit).  You are very sleepy (drowsy) all the  time.  Your face is puffy (swollen).  Your vision changes.  You have a stiff neck.  You have trouble breathing. MAKE SURE YOU:   Understand these instructions.  Will watch your condition.  Will get help right away if you are not doing well or get worse.   This information is not intended to replace advice given to you by your health care provider. Make sure you discuss any questions you have with your health care provider.   Document Released: 04/16/2008 Document Revised: 11/19/2014 Document Reviewed: 06/03/2012 Elsevier Interactive Patient Education 2016 [employer]  Take the prescription antibiotic as directed. Follow-up with Dr. Dossie Arbourrissman as needed. Start and OTC allergy  medicine + pseudoephedrine. Restart your daily Flonase.

## 2015-11-24 NOTE — ED Notes (Signed)
Pt reports rt sided dental pain since monday

## 2015-12-28 ENCOUNTER — Emergency Department
Admission: EM | Admit: 2015-12-28 | Discharge: 2015-12-28 | Disposition: A | Discharge status: Home / Self Care | Payer: Self-pay | Attending: Emergency Medicine | Admitting: Emergency Medicine

## 2015-12-28 ENCOUNTER — Encounter: Payer: Self-pay | Admitting: *Deleted

## 2015-12-28 DIAGNOSIS — Z7951 Long term (current) use of inhaled steroids: Secondary | ICD-10-CM | POA: Insufficient documentation

## 2015-12-28 DIAGNOSIS — Z880 Allergy status to penicillin: Secondary | ICD-10-CM | POA: Insufficient documentation

## 2015-12-28 DIAGNOSIS — F17210 Nicotine dependence, cigarettes, uncomplicated: Secondary | ICD-10-CM | POA: Insufficient documentation

## 2015-12-28 DIAGNOSIS — J320 Chronic maxillary sinusitis: Secondary | ICD-10-CM | POA: Insufficient documentation

## 2015-12-28 DIAGNOSIS — Z792 Long term (current) use of antibiotics: Secondary | ICD-10-CM | POA: Insufficient documentation

## 2015-12-28 MED ORDER — PREDNISONE 10 MG PO TABS: 10.0000 mg | ORAL_TABLET | Freq: Two times a day (BID) | ORAL | Status: AC

## 2015-12-28 MED ORDER — TRAMADOL HCL 50 MG PO TABS
50.0000 mg | ORAL_TABLET | Freq: Once | ORAL | Status: AC
Start: 2015-12-28 — End: 2015-12-28
  Administered 2015-12-28: 50 mg via ORAL
  Filled 2015-12-28: qty 1

## 2015-12-28 MED ORDER — DEXAMETHASONE SODIUM PHOSPHATE 10 MG/ML IJ SOLN
10.0000 mg | Freq: Once | INTRAMUSCULAR | Status: AC
  Administered 2015-12-28: 10 mg via INTRAMUSCULAR
  Filled 2015-12-28: qty 1

## 2015-12-28 MED ORDER — LORATADINE-PSEUDOEPHEDRINE ER 5-120 MG PO TB12: 1.0000 | ORAL_TABLET | Freq: Two times a day (BID) | ORAL | Status: AC

## 2015-12-28 NOTE — Discharge Instructions (Signed)

## 2015-12-28 NOTE — ED Notes (Signed)
Pt c/o right sided sinus pressure and right ear pain. Pt reports having fever of 100.1 yesterday, pt reports taking tylenol and sinex with relief. Pt denies other complaints at this time. No increased work in breathing noted. Skin warm and dry.

## 2015-12-28 NOTE — ED Notes (Signed)
Pt complains of sinus pressure/ pain, pt denies any other symptoms

## 2015-12-28 NOTE — ED Provider Notes (Signed)
[facility] Emergency Department Provider Note ____________________________________________  Time seen: 2136  I have reviewed the triage vital signs and the nursing notes.  HISTORY  Chief Complaint  Facial Pain  HPI Colleen Bauer is a 50 y.o. female presents to the ED for evaluation of sudden facial and ear pressure on the right side.She describes a fever of 100.74F yesterday. She's been taking Tylenol and over-the-counter sinus allergy medicine without relief. She denies any bloody discharge, purulent nasal drainage or any dizziness. She claims ahead his pressure been so severe she was unable to report to work today. She is here tearful in the room requesting pain medicine for her sinus pressure.  History reviewed. No pertinent past medical history.  There are no active problems to display for this patient.  Past Surgical History  Procedure Laterality Date  . Back surgery    . Arm surgery    . Nephrectomy transplanted organ    . Tonsillectomy      Current Outpatient Rx  Name  Route  Sig  Dispense  Refill  . amoxicillin (AMOXIL) 500 MG capsule   Oral   Take 1 capsule (500 mg total) by mouth 3 (three) times daily.   30 capsule   0   . fluticasone (FLONASE) 50 MCG/ACT nasal spray   Each Nare   Place 1 spray into both nostrils daily.   16 g   0   . loratadine-pseudoephedrine (CLARITIN-D 12 HOUR) 5-120 MG tablet   Oral   Take 1 tablet by mouth 2 (two) times daily.   30 tablet   0   . oxycodone (ROXICODONE) 30 MG immediate release tablet   Oral   Take 30 mg by mouth every 4 (four) hours as needed for pain.         . predniSONE (DELTASONE) 10 MG tablet   Oral   Take 1 tablet (10 mg total) by mouth 2 (two) times daily with a meal.   10 tablet   0    Allergies Penicillins  No family history on file.  Social History Social History  Substance Use Topics  . Smoking status: Current Every Day Smoker -- 0.50 packs/day    Types:  Cigarettes  . Smokeless tobacco: None  . Alcohol Use: No   Review of Systems  Constitutional: Negative for fever. Eyes: Negative for visual changes. ENT: Negative for sore throat. Reports nasal drainage and sinus pressure as above. Cardiovascular: Negative for chest pain. Respiratory: Negative for shortness of breath. Skin: Negative for rash. Neurological: Negative for headaches, focal weakness or numbness. ____________________________________________  PHYSICAL EXAM:  VITAL SIGNS: ED Triage Vitals  Enc Vitals Group     BP 12/28/15 1842 151/96 mmHg     Pulse Rate 12/28/15 1842 86     Resp 12/28/15 1842 20     Temp 12/28/15 1842 98 F (36.7 C)     Temp Source 12/28/15 1842 Oral     SpO2 12/28/15 1842 98 %     Weight 12/28/15 1842 200 lb (90.719 kg)     Height 12/28/15 1842  (1.676 m)     Head Cir --      Peak Flow --      Pain Score 12/28/15 1844 10     Pain Loc --      Pain Edu? --      Excl. in GC? --    Constitutional: Alert and oriented. Well appearing and in no distress. Head: Normocephalic and atraumatic. Patient  to palpation over the right maxillary sinus.      Eyes: Conjunctivae are normal. PERRL. Normal extraocular movements      Ears: Canals clear. TMs intact bilaterally. No serous or purulent effusion is appreciated.    Nose: No congestion. Patient with clear rhinorrhea. No nasal mucosal edema is appreciated.   Mouth/Throat: Mucous membranes are moist.   Neck: Supple. No thyromegaly. Hematological/Lymphatic/Immunological: No cervical lymphadenopathy. Cardiovascular: Normal rate, regular rhythm.  Respiratory: Normal respiratory effort. No wheezes/rales/rhonchi. Musculoskeletal: Nontender with normal range of motion in all extremities.  Neurologic:  Normal gait without ataxia. Normal speech and language. No gross focal neurologic deficits are appreciated. Skin:  Skin is warm, dry and intact. No rash noted. Psychiatric: Mood and affect are normal.  Patient exhibits appropriate insight and judgment. ____________________________________________  PROCEDURES  Decadron 10 mg IM ____________________________________________  INITIAL IMPRESSION / ASSESSMENT AND PLAN / ED COURSE  Patient with what may represent a chronic maxillary sinusitis. She does not appear this time to have an infectious process given the short duration of her symptoms. She will be discharged this time with a prescription for prednisone and loratadine-pseudoephedrine to dose for sinus pressure relief. She is also repeated referred to Dr. Willeen Cass for further evaluation and consultation by ENT. This visible mark about the third time in about 8 months that the patient has been evaluated with a complaint of sinusitis. She is to continue dosing the Flonase previously prescribed. There is no indication based on her clinical presentation to prescribe antibiotics at this time. ____________________________________________  FINAL CLINICAL IMPRESSION(S) / ED DIAGNOSES  Final diagnoses:  Sinusitis, maxillary, chronic      Lissa Hoard, PA-C 12/28/15 2142  Maurilio Lovely, MD 12/28/15 2339

## 2018-11-18 ENCOUNTER — Other Ambulatory Visit: Payer: Self-pay

## 2018-11-18 ENCOUNTER — Emergency Department
Admission: EM | Admit: 2018-11-18 | Discharge: 2018-11-18 | Disposition: A | Discharge status: Home / Self Care | Payer: No Typology Code available for payment source | Attending: Emergency Medicine | Admitting: Emergency Medicine

## 2018-11-18 ENCOUNTER — Emergency Department: Payer: No Typology Code available for payment source

## 2018-11-18 ENCOUNTER — Encounter: Payer: Self-pay | Admitting: Emergency Medicine

## 2018-11-18 DIAGNOSIS — M7918 Myalgia, other site: Secondary | ICD-10-CM

## 2018-11-18 DIAGNOSIS — M545 Low back pain, unspecified: Secondary | ICD-10-CM

## 2018-11-18 DIAGNOSIS — F17210 Nicotine dependence, cigarettes, uncomplicated: Secondary | ICD-10-CM | POA: Insufficient documentation

## 2018-11-18 DIAGNOSIS — M25561 Pain in right knee: Secondary | ICD-10-CM | POA: Diagnosis present

## 2018-11-18 DIAGNOSIS — Z79899 Other long term (current) drug therapy: Secondary | ICD-10-CM | POA: Diagnosis not present

## 2018-11-18 MED ORDER — CYCLOBENZAPRINE HCL 10 MG PO TABS: 10.0000 mg | ORAL_TABLET | Freq: Three times a day (TID) | ORAL | 0 refills | Status: AC | PRN
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
## 2018-11-18 NOTE — Discharge Instructions (Signed)
Follow up with your primary care provider for symptoms that are not improving over the week. °Return to the ER for symptoms that change or worsen if unable to schedule an appointment. °

## 2019-03-02 ENCOUNTER — Other Ambulatory Visit: Payer: Self-pay | Admitting: Orthopedic Surgery

## 2019-03-02 DIAGNOSIS — M47816 Spondylosis without myelopathy or radiculopathy, lumbar region: Secondary | ICD-10-CM

## 2019-03-02 DIAGNOSIS — M5386 Other specified dorsopathies, lumbar region: Secondary | ICD-10-CM

## 2019-04-15 ENCOUNTER — Other Ambulatory Visit: Payer: Self-pay | Admitting: Orthopedic Surgery

## 2019-04-15 ENCOUNTER — Other Ambulatory Visit: Payer: Self-pay

## 2019-04-15 ENCOUNTER — Other Ambulatory Visit: Payer: Self-pay | Admitting: Neurology

## 2019-04-15 ENCOUNTER — Ambulatory Visit
Admission: RE | Admit: 2019-04-15 | Discharge: 2019-04-15 | Disposition: A | Discharge status: Home / Self Care | Payer: Self-pay | Source: Ambulatory Visit

## 2019-04-15 DIAGNOSIS — M5386 Other specified dorsopathies, lumbar region: Secondary | ICD-10-CM

## 2019-04-15 DIAGNOSIS — M47816 Spondylosis without myelopathy or radiculopathy, lumbar region: Secondary | ICD-10-CM

## 2019-04-17 ENCOUNTER — Other Ambulatory Visit: Payer: Self-pay | Admitting: Orthopedic Surgery

## 2019-04-17 DIAGNOSIS — M4306 Spondylolysis, lumbar region: Secondary | ICD-10-CM

## 2019-04-17 DIAGNOSIS — M5386 Other specified dorsopathies, lumbar region: Secondary | ICD-10-CM

## 2019-04-17 DIAGNOSIS — M461 Sacroiliitis, not elsewhere classified: Secondary | ICD-10-CM

## 2019-04-21 ENCOUNTER — Other Ambulatory Visit: Payer: Self-pay | Admitting: Orthopedic Surgery

## 2019-04-21 ENCOUNTER — Other Ambulatory Visit: Payer: Self-pay | Admitting: Orthopaedic Surgery

## 2019-04-21 DIAGNOSIS — M47816 Spondylosis without myelopathy or radiculopathy, lumbar region: Secondary | ICD-10-CM

## 2019-04-21 DIAGNOSIS — M461 Sacroiliitis, not elsewhere classified: Secondary | ICD-10-CM

## 2019-05-27 ENCOUNTER — Other Ambulatory Visit: Payer: Self-pay | Admitting: Orthopedic Surgery

## 2019-05-27 DIAGNOSIS — M5430 Sciatica, unspecified side: Secondary | ICD-10-CM

## 2019-05-27 DIAGNOSIS — M47816 Spondylosis without myelopathy or radiculopathy, lumbar region: Secondary | ICD-10-CM

## 2021-05-02 ENCOUNTER — Ambulatory Visit (LOCAL_COMMUNITY_HEALTH_CENTER): Payer: Self-pay

## 2021-05-02 ENCOUNTER — Other Ambulatory Visit: Payer: Self-pay

## 2021-05-02 DIAGNOSIS — Z111 Encounter for screening for respiratory tuberculosis: Secondary | ICD-10-CM

## 2021-05-05 ENCOUNTER — Other Ambulatory Visit: Payer: Self-pay

## 2021-05-05 ENCOUNTER — Ambulatory Visit (LOCAL_COMMUNITY_HEALTH_CENTER): Payer: Self-pay

## 2021-05-05 DIAGNOSIS — Z111 Encounter for screening for respiratory tuberculosis: Secondary | ICD-10-CM

## 2021-05-05 LAB — TB SKIN TEST
Induration: 0 mm
TB Skin Test: NEGATIVE
